# Patient Record
Sex: Female | Born: 1955 | Race: White | Hispanic: No | Marital: Married | Smoking: Never smoker
Health system: Southern US, Community
[De-identification: ages and names within clinical notes are randomized; demographics above are authoritative.]

## PROBLEM LIST (undated history)

## (undated) DIAGNOSIS — M8580 Other specified disorders of bone density and structure, unspecified site: Secondary | ICD-10-CM

## (undated) DIAGNOSIS — Z923 Personal history of irradiation: Secondary | ICD-10-CM

## (undated) DIAGNOSIS — E349 Endocrine disorder, unspecified: Secondary | ICD-10-CM

## (undated) DIAGNOSIS — C801 Malignant (primary) neoplasm, unspecified: Secondary | ICD-10-CM

## (undated) HISTORY — DX: Personal history of irradiation: Z92.3

## (undated) HISTORY — DX: Malignant (primary) neoplasm, unspecified: C80.1

## (undated) HISTORY — PX: OOPHORECTOMY: SHX86

## (undated) HISTORY — PX: BREAST BIOPSY: SHX20

## (undated) HISTORY — DX: Endocrine disorder, unspecified: E34.9

## (undated) HISTORY — DX: Other specified disorders of bone density and structure, unspecified site: M85.80

## (undated) HISTORY — PX: BREAST SURGERY: SHX581

## (undated) HISTORY — PX: BREAST EXCISIONAL BIOPSY: SUR124

---

## 2010-06-17 DIAGNOSIS — C801 Malignant (primary) neoplasm, unspecified: Secondary | ICD-10-CM

## 2010-06-17 DIAGNOSIS — C50919 Malignant neoplasm of unspecified site of unspecified female breast: Secondary | ICD-10-CM

## 2010-06-17 DIAGNOSIS — Z923 Personal history of irradiation: Secondary | ICD-10-CM

## 2010-06-17 HISTORY — DX: Malignant neoplasm of unspecified site of unspecified female breast: C50.919

## 2010-06-17 HISTORY — PX: BREAST LUMPECTOMY: SHX2

## 2010-06-17 HISTORY — DX: Personal history of irradiation: Z92.3

## 2010-06-17 HISTORY — DX: Malignant (primary) neoplasm, unspecified: C80.1

## 2016-06-26 ENCOUNTER — Ambulatory Visit: Payer: 59 | Admitting: Women's Health

## 2016-08-13 ENCOUNTER — Ambulatory Visit (INDEPENDENT_AMBULATORY_CARE_PROVIDER_SITE_OTHER): Payer: 59 | Admitting: Women's Health

## 2016-08-13 ENCOUNTER — Other Ambulatory Visit: Payer: Self-pay | Admitting: Women's Health

## 2016-08-13 ENCOUNTER — Encounter: Payer: Self-pay | Admitting: Women's Health

## 2016-08-13 VITALS — BP 132/80 | Ht 64.0 in | Wt 133.0 lb

## 2016-08-13 DIAGNOSIS — Z13220 Encounter for screening for lipoid disorders: Secondary | ICD-10-CM | POA: Diagnosis not present

## 2016-08-13 DIAGNOSIS — E038 Other specified hypothyroidism: Secondary | ICD-10-CM

## 2016-08-13 DIAGNOSIS — C50919 Malignant neoplasm of unspecified site of unspecified female breast: Secondary | ICD-10-CM | POA: Insufficient documentation

## 2016-08-13 DIAGNOSIS — Z01419 Encounter for gynecological examination (general) (routine) without abnormal findings: Secondary | ICD-10-CM | POA: Diagnosis not present

## 2016-08-13 DIAGNOSIS — Z13820 Encounter for screening for osteoporosis: Secondary | ICD-10-CM

## 2016-08-13 DIAGNOSIS — Z1231 Encounter for screening mammogram for malignant neoplasm of breast: Secondary | ICD-10-CM

## 2016-08-13 DIAGNOSIS — M8580 Other specified disorders of bone density and structure, unspecified site: Secondary | ICD-10-CM | POA: Diagnosis not present

## 2016-08-13 LAB — COMPREHENSIVE METABOLIC PANEL
ALK PHOS: 54 U/L (ref 33–130)
ALT: 29 U/L (ref 6–29)
AST: 24 U/L (ref 10–35)
Albumin: 4.4 g/dL (ref 3.6–5.1)
BUN: 16 mg/dL (ref 7–25)
CO2: 25 mmol/L (ref 20–31)
CREATININE: 0.68 mg/dL (ref 0.50–1.05)
Calcium: 9.4 mg/dL (ref 8.6–10.4)
Chloride: 103 mmol/L (ref 98–110)
Glucose, Bld: 110 mg/dL — ABNORMAL HIGH (ref 65–99)
POTASSIUM: 4.3 mmol/L (ref 3.5–5.3)
SODIUM: 138 mmol/L (ref 135–146)
TOTAL PROTEIN: 7.3 g/dL (ref 6.1–8.1)
Total Bilirubin: 0.5 mg/dL (ref 0.2–1.2)

## 2016-08-13 LAB — CBC WITH DIFFERENTIAL/PLATELET
BASOS PCT: 1 %
Basophils Absolute: 50 cells/uL (ref 0–200)
EOS ABS: 100 {cells}/uL (ref 15–500)
Eosinophils Relative: 2 %
HCT: 40.1 % (ref 35.0–45.0)
Hemoglobin: 13.7 g/dL (ref 11.7–15.5)
LYMPHS ABS: 1900 {cells}/uL (ref 850–3900)
Lymphocytes Relative: 38 %
MCH: 32 pg (ref 27.0–33.0)
MCHC: 34.2 g/dL (ref 32.0–36.0)
MCV: 93.7 fL (ref 80.0–100.0)
MONOS PCT: 9 %
MPV: 9.6 fL (ref 7.5–12.5)
Monocytes Absolute: 450 cells/uL (ref 200–950)
NEUTROS ABS: 2500 {cells}/uL (ref 1500–7800)
Neutrophils Relative %: 50 %
PLATELETS: 266 10*3/uL (ref 140–400)
RBC: 4.28 MIL/uL (ref 3.80–5.10)
RDW: 13.3 % (ref 11.0–15.0)
WBC: 5 10*3/uL (ref 3.8–10.8)

## 2016-08-13 LAB — LIPID PANEL
CHOLESTEROL: 226 mg/dL — AB (ref <200)
HDL: 147 mg/dL (ref >50)
LDL Cholesterol: 69 mg/dL (ref <100)
Total CHOL/HDL Ratio: 1.5 Ratio (ref <5.0)
Triglycerides: 51 mg/dL (ref <150)
VLDL: 10 mg/dL (ref <30)

## 2016-08-13 LAB — TSH: TSH: 0.91 m[IU]/L

## 2016-08-13 MED ORDER — LEVOTHYROXINE SODIUM 88 MCG PO TABS: 88.0000 ug | ORAL_TABLET | Freq: Every day | ORAL | 4 refills | Status: AC

## 2016-08-13 NOTE — Patient Instructions (Signed)
Dr Jana Hakim  Oncologist [facility] [phone]   Health Maintenance for Postmenopausal Women Menopause is a normal process in which your reproductive ability comes to an end. This process happens gradually over a span of months to years, usually between the ages of 36 and 77. Menopause is complete when you have missed 12 consecutive menstrual periods. It is important to talk with your health care provider about some of the most common conditions that affect postmenopausal women, such as heart disease, cancer, and bone loss (osteoporosis). Adopting a healthy lifestyle and getting preventive care can help to promote your health and wellness. Those actions can also lower your chances of developing some of these common conditions. What should I know about menopause? During menopause, you may experience a number of symptoms, such as:  Moderate-to-severe hot flashes.  Night sweats.  Decrease in sex drive.  Mood swings.  Headaches.  Tiredness.  Irritability.  Memory problems.  Insomnia. Choosing to treat or not to treat menopausal changes is an individual decision that you make with your health care provider. What should I know about hormone replacement therapy and supplements? Hormone therapy products are effective for treating symptoms that are associated with menopause, such as hot flashes and night sweats. Hormone replacement carries certain risks, especially as you become older. If you are thinking about using estrogen or estrogen with progestin treatments, discuss the benefits and risks with your health care provider. What should I know about heart disease and stroke? Heart disease, heart attack, and stroke become more likely as you age. This may be due, in part, to the hormonal changes that your body experiences during menopause. These can affect how your body processes dietary fats, triglycerides, and cholesterol. Heart attack and stroke are both medical  emergencies. There are many things that you can do to help prevent heart disease and stroke:  Have your blood pressure checked at least every 1-2 years. High blood pressure causes heart disease and increases the risk of stroke.  If you are 42-23 years old, ask your health care provider if you should take aspirin to prevent a heart attack or a stroke.  Do not use any tobacco products, including cigarettes, chewing tobacco, or electronic cigarettes. If you need help quitting, ask your health care provider.  It is important to eat a healthy diet and maintain a healthy weight.  Be sure to include plenty of vegetables, fruits, low-fat dairy products, and lean protein.  Avoid eating foods that are high in solid fats, added sugars, or salt (sodium).  Get regular exercise. This is one of the most important things that you can do for your health.  Try to exercise for at least 150 minutes each week. The type of exercise that you do should increase your heart rate and make you sweat. This is known as moderate-intensity exercise.  Try to do strengthening exercises at least twice each week. Do these in addition to the moderate-intensity exercise.  Know your numbers.Ask your health care provider to check your cholesterol and your blood glucose. Continue to have your blood tested as directed by your health care provider. What should I know about cancer screening? There are several types of cancer. Take the following steps to reduce your risk and to catch any cancer development as early as possible. Breast Cancer  Practice breast self-awareness.  This means understanding how your breasts normally appear and feel.  It also means doing regular breast self-exams. Let your health care provider know about any changes,  no matter how small.  If you are 92 or older, have a clinician do a breast exam (clinical breast exam or CBE) every year. Depending on your age, family history, and medical history, it may  be recommended that you also have a yearly breast X-ray (mammogram).  If you have a family history of breast cancer, talk with your health care provider about genetic screening.  If you are at high risk for breast cancer, talk with your health care provider about having an MRI and a mammogram every year.  Breast cancer (BRCA) gene test is recommended for women who have family members with BRCA-related cancers. Results of the assessment will determine the need for genetic counseling and BRCA1 and for BRCA2 testing. BRCA-related cancers include these types:  Breast. This occurs in males or females.  Ovarian.  Tubal. This may also be called fallopian tube cancer.  Cancer of the abdominal or pelvic lining (peritoneal cancer).  Prostate.  Pancreatic. Cervical, Uterine, and Ovarian Cancer  Your health care provider may recommend that you be screened regularly for cancer of the pelvic organs. These include your ovaries, uterus, and vagina. This screening involves a pelvic exam, which includes checking for microscopic changes to the surface of your cervix (Pap test).  For women ages 21-65, health care providers may recommend a pelvic exam and a Pap test every three years. For women ages 70-65, they may recommend the Pap test and pelvic exam, combined with testing for human papilloma virus (HPV), every five years. Some types of HPV increase your risk of cervical cancer. Testing for HPV may also be done on women of any age who have unclear Pap test results.  Other health care providers may not recommend any screening for nonpregnant women who are considered low risk for pelvic cancer and have no symptoms. Ask your health care provider if a screening pelvic exam is right for you.  If you have had past treatment for cervical cancer or a condition that could lead to cancer, you need Pap tests and screening for cancer for at least 20 years after your treatment. If Pap tests have been discontinued for  you, your risk factors (such as having a new sexual partner) need to be reassessed to determine if you should start having screenings again. Some women have medical problems that increase the chance of getting cervical cancer. In these cases, your health care provider may recommend that you have screening and Pap tests more often.  If you have a family history of uterine cancer or ovarian cancer, talk with your health care provider about genetic screening.  If you have vaginal bleeding after reaching menopause, tell your health care provider.  There are currently no reliable tests available to screen for ovarian cancer. Lung Cancer  Lung cancer screening is recommended for adults 4-92 years old who are at high risk for lung cancer because of a history of smoking. A yearly low-dose CT scan of the lungs is recommended if you:  Currently smoke.  Have a history of at least 30 pack-years of smoking and you currently smoke or have quit within the past 15 years. A pack-year is smoking an average of one pack of cigarettes per day for one year. Yearly screening should:  Continue until it has been 15 years since you quit.  Stop if you develop a health problem that would prevent you from having lung cancer treatment. Colorectal Cancer  This type of cancer can be detected and can often be prevented.  Routine colorectal cancer screening usually begins at age 39 and continues through age 76.  If you have risk factors for colon cancer, your health care provider may recommend that you be screened at an earlier age.  If you have a family history of colorectal cancer, talk with your health care provider about genetic screening.  Your health care provider may also recommend using home test kits to check for hidden blood in your stool.  A small camera at the end of a tube can be used to examine your colon directly (sigmoidoscopy or colonoscopy). This is done to check for the earliest forms of colorectal  cancer.  Direct examination of the colon should be repeated every 5-10 years until age 20. However, if early forms of precancerous polyps or small growths are found or if you have a family history or genetic risk for colorectal cancer, you may need to be screened more often. Skin Cancer  Check your skin from head to toe regularly.  Monitor any moles. Be sure to tell your health care provider:  About any new moles or changes in moles, especially if there is a change in a mole's shape or color.  If you have a mole that is larger than the size of a pencil eraser.  If any of your family members has a history of skin cancer, especially at a Aadam Zhen age, talk with your health care provider about genetic screening.  Always use sunscreen. Apply sunscreen liberally and repeatedly throughout the day.  Whenever you are outside, protect yourself by wearing long sleeves, pants, a wide-brimmed hat, and sunglasses. What should I know about osteoporosis? Osteoporosis is a condition in which bone destruction happens more quickly than new bone creation. After menopause, you may be at an increased risk for osteoporosis. To help prevent osteoporosis or the bone fractures that can happen because of osteoporosis, the following is recommended:  If you are 30-76 years old, get at least 1,000 mg of calcium and at least 600 mg of vitamin D per day.  If you are older than age 71 but younger than age 28, get at least 1,200 mg of calcium and at least 600 mg of vitamin D per day.  If you are older than age 72, get at least 1,200 mg of calcium and at least 800 mg of vitamin D per day. Smoking and excessive alcohol intake increase the risk of osteoporosis. Eat foods that are rich in calcium and vitamin D, and do weight-bearing exercises several times each week as directed by your health care provider. What should I know about how menopause affects my mental health? Depression may occur at any age, but it is more common as  you become older. Common symptoms of depression include:  Low or sad mood.  Changes in sleep patterns.  Changes in appetite or eating patterns.  Feeling an overall lack of motivation or enjoyment of activities that you previously enjoyed.  Frequent crying spells. Talk with your health care provider if you think that you are experiencing depression. What should I know about immunizations? It is important that you get and maintain your immunizations. These include:  Tetanus, diphtheria, and pertussis (Tdap) booster vaccine.  Influenza every year before the flu season begins.  Pneumonia vaccine.  Shingles vaccine. Your health care provider may also recommend other immunizations. This information is not intended to replace advice given to you by your health care provider. Make sure you discuss any questions you have with your health care provider. Document  Released: 07/26/2005 Document Revised: 12/22/2015 Document Reviewed: 03/07/2015 Elsevier Interactive Patient Education  2017 [employer].

## 2016-08-13 NOTE — Progress Notes (Signed)
Angela Madden [birth year] [id number]    History:    Presents for new pt annual exam. Moved from [place] 60 mo ago, records reviewed.  Postmenopausal, LMP 2014/no HRT. 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 BRCA negative.  Lumpectomy, radiation, completed 5 years of tamoxifen.. L breast hx of microcalcification and fibrocystic changes- biopsy benign. Hx of R oophorectomy in 103-17-1991d/t gangrenous ovary. Hx of hypothyroidism, controlled on Levothyroxine, superficial blood clots managed on baby Aspirin, allergies- Levocetrizine BID and Oxymetazoline HCl, and vit B12 and D QD. Last mammogram 04/17/15- nml, colonoscopy 2March 17, 2017nml, pap 06/2014 nml, negative high risk HPV, DEXA 06/2013 osteopenia L1-L4. Has received shingles vaccine. Complains of dyspareunia, wants to start something for relief.   Past medical history, past surgical history, family history and social history were all reviewed and documented in the EPIC chart. Retired, [place], difficulty transitioning. Lives with husband of 403yrs, also retired, and daughter 267 Son 24l[place] in [place] Father, died 26010-03-17 natural causes, hx of DM and HTN. Mother, died 603from Breast CA. Sister also has hx of DM. Drinks 3 glasses of wine every night.  ROS:  A ROS was performed and pertinent positives and negatives are included.  Exam:  Vitals:   08/13/16 0901  BP: 132/80  Weight: 133 lb (60.3 kg)  Height: '5\' 4"'  (1.626 m)   Body mass index is 22.83 kg/m.   General appearance:  Normal Thyroid:  Symmetrical, normal in size, without palpable masses or nodularity. Respiratory  Auscultation:  Clear without wheezing or rhonchi Cardiovascular  Auscultation:  Regular rate, without rubs, murmurs or gallops  Edema/varicosities:  Not grossly evident Abdominal  Soft,nontender, without masses, guarding or rebound.  Liver/spleen:  No organomegaly noted  Hernia:  None  appreciated  Skin  Inspection:  Grossly normal   Breasts: Examined lying and sitting. Dense, tender breasts.    Right: Without masses, retractions, discharge or axillary adenopathy. Scar on outline of nipple from surgery.     Left: Without masses, retractions, discharge or axillary adenopathy. Gentitourinary   Inguinal/mons:  Normal without inguinal adenopathy  External genitalia:  Normal  BUS/Urethra/Skene's glands:  Normal  Vagina:  Normal, dry  Cervix:  Normal  Uterus: retroerted, normal in size, shape and contour.  Midline and mobile  Adnexa/parametria:     Rt: Without masses or tenderness.   Lt: Without masses or tenderness.  Anus and perineum: Normal  Digital rectal exam: Normal sphincter tone without palpated masses or tenderness  Assessment/Plan:  61y.o.  G2P2 for new pt annual exam.    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  Levothyroxine 88 g by mouth daily proper administration reviewed. Refill given.. Referred to Oncologist for Breast CA f/u . Informed to make appt for another DEXA scan. Counseled on healthy eating and exercise. Encouraged to get Pneumovax and flu shots every year. Suggested she joins womens activity groups to help with settling in. CBC, CMP, Lipid, TSH, Vit D. Pap normal with negative HR HPV 602016/03/17 Informed to call if she has any questions of concerns.   YHuel CoteWHNP, 10:01  AM 60/27/2018

## 2016-08-14 ENCOUNTER — Other Ambulatory Visit: Payer: Self-pay | Admitting: Gynecology

## 2016-08-14 DIAGNOSIS — Z13820 Encounter for screening for osteoporosis: Secondary | ICD-10-CM

## 2016-08-14 DIAGNOSIS — M8580 Other specified disorders of bone density and structure, unspecified site: Secondary | ICD-10-CM

## 2016-08-14 LAB — HEMOGLOBIN A1C
HEMOGLOBIN A1C: 5.2 % (ref <5.7)
MEAN PLASMA GLUCOSE: 103 mg/dL

## 2016-08-14 LAB — VITAMIN D 25 HYDROXY (VIT D DEFICIENCY, FRACTURES): Vit D, 25-Hydroxy: 39 ng/mL (ref 30–100)

## 2016-08-15 DIAGNOSIS — M8580 Other specified disorders of bone density and structure, unspecified site: Secondary | ICD-10-CM

## 2016-08-15 HISTORY — DX: Other specified disorders of bone density and structure, unspecified site: M85.80

## 2016-08-20 ENCOUNTER — Other Ambulatory Visit: Payer: Self-pay | Admitting: Gynecology

## 2016-08-20 ENCOUNTER — Ambulatory Visit (INDEPENDENT_AMBULATORY_CARE_PROVIDER_SITE_OTHER): Payer: 59

## 2016-08-20 DIAGNOSIS — M8589 Other specified disorders of bone density and structure, multiple sites: Secondary | ICD-10-CM

## 2016-08-20 DIAGNOSIS — Z13820 Encounter for screening for osteoporosis: Secondary | ICD-10-CM | POA: Diagnosis not present

## 2016-08-20 DIAGNOSIS — M8580 Other specified disorders of bone density and structure, unspecified site: Secondary | ICD-10-CM

## 2016-08-21 ENCOUNTER — Encounter: Payer: Self-pay | Admitting: Gynecology

## 2016-08-30 ENCOUNTER — Ambulatory Visit
Admission: RE | Admit: 2016-08-30 | Discharge: 2016-08-30 | Disposition: A | Discharge status: Home / Self Care | Payer: 59 | Source: Ambulatory Visit | Attending: Women's Health | Admitting: Women's Health

## 2016-08-30 ENCOUNTER — Other Ambulatory Visit: Payer: Self-pay | Admitting: Women's Health

## 2016-08-30 ENCOUNTER — Telehealth: Payer: Self-pay | Admitting: *Deleted

## 2016-08-30 DIAGNOSIS — Z1231 Encounter for screening mammogram for malignant neoplasm of breast: Secondary | ICD-10-CM

## 2016-08-30 HISTORY — DX: Personal history of irradiation: Z92.3

## 2016-08-30 NOTE — Telephone Encounter (Signed)
Arena from breast center called stating pt will need a diagnostic bilateral mammogram and ultrasound due to history of breast center. Asked if verbal order okay. Pt was scheduled today for this. Breast center aware to proceed with the above.

## 2017-09-02 ENCOUNTER — Encounter: Payer: 59 | Admitting: Women's Health

## 2017-09-12 ENCOUNTER — Other Ambulatory Visit: Payer: Self-pay | Admitting: Obstetrics & Gynecology

## 2017-09-12 ENCOUNTER — Other Ambulatory Visit: Payer: Self-pay | Admitting: Women's Health

## 2017-09-12 DIAGNOSIS — Z139 Encounter for screening, unspecified: Secondary | ICD-10-CM

## 2017-10-02 ENCOUNTER — Ambulatory Visit
Admission: RE | Admit: 2017-10-02 | Discharge: 2017-10-02 | Disposition: A | Discharge status: Home / Self Care | Payer: 59 | Source: Ambulatory Visit | Attending: Women's Health | Admitting: Women's Health

## 2017-10-02 DIAGNOSIS — Z139 Encounter for screening, unspecified: Secondary | ICD-10-CM

## 2017-12-25 ENCOUNTER — Ambulatory Visit (INDEPENDENT_AMBULATORY_CARE_PROVIDER_SITE_OTHER): Payer: No Typology Code available for payment source | Admitting: Obstetrics & Gynecology

## 2017-12-25 ENCOUNTER — Encounter: Payer: Self-pay | Admitting: Obstetrics & Gynecology

## 2017-12-25 VITALS — BP 124/70 | Ht 63.0 in | Wt 123.0 lb

## 2017-12-25 DIAGNOSIS — M8589 Other specified disorders of bone density and structure, multiple sites: Secondary | ICD-10-CM

## 2017-12-25 DIAGNOSIS — Z780 Asymptomatic menopausal state: Secondary | ICD-10-CM

## 2017-12-25 DIAGNOSIS — R3121 Asymptomatic microscopic hematuria: Secondary | ICD-10-CM | POA: Diagnosis not present

## 2017-12-25 DIAGNOSIS — Z01419 Encounter for gynecological examination (general) (routine) without abnormal findings: Secondary | ICD-10-CM | POA: Diagnosis not present

## 2017-12-25 DIAGNOSIS — Z853 Personal history of malignant neoplasm of breast: Secondary | ICD-10-CM

## 2017-12-25 DIAGNOSIS — N952 Postmenopausal atrophic vaginitis: Secondary | ICD-10-CM

## 2017-12-25 DIAGNOSIS — Z170 Estrogen receptor positive status [ER+]: Secondary | ICD-10-CM | POA: Diagnosis not present

## 2017-12-25 DIAGNOSIS — C50911 Malignant neoplasm of unspecified site of right female breast: Secondary | ICD-10-CM

## 2017-12-25 NOTE — Patient Instructions (Signed)
1. Encounter for routine gynecological examination with Papanicolaou smear of cervix Normal gynecologic exam in menopause with atrophic vaginitis and atrophy of the vulvar skin.  Pap reflex done.  Breast exam normal.  Screening 3D mammogram was negative in April 2019.  Fasting health labs here today.  Colonoscopy in 2017. - CBC - Comp Met (CMET) - TSH - Lipid panel - VITAMIN D 25 Hydroxy (Vit-D Deficiency, Fractures)  2. Menopause present Well on no hormone replacement therapy.  No postmenopausal bleeding.  3. Post-menopausal atrophic vaginitis Symptoms of postmenopausal atrophic vaginitis and atrophic of the vulvar skin are causing dryness and pain with intercourse.  Suggested to try Coconut oil.  Astroglide is another possibility as a lubricant alternative to [place].  4. Malignant neoplasm of right breast in female, estrogen receptor positive, unspecified site of breast ([place]) Normal breast exam today and negative screening mammogram in April 2019.  5. Osteopenia of multiple sites Last bone density in March 2018 showed osteopenia with the lowest T score at the spine and right femoral neck at -1.8.  Will check her vitamin D today.  Continue with vitamin D supplements, calcium rich nutrition and regular weightbearing physical activity.  Will repeat bone density in March 2020.  Aryiah, it was a pleasure meeting you today!  I will inform you of your results as soon as they are available.

## 2017-12-25 NOTE — Addendum Note (Signed)
Addended by: Thurnell Garbe A on: 12/25/2017 12:38 PM   Modules accepted: Orders

## 2017-12-25 NOTE — Addendum Note (Signed)
Addended by: Thurnell Garbe A on: 12/25/2017 02:00 PM   Modules accepted: Orders

## 2017-12-25 NOTE — Progress Notes (Addendum)
Angela Madden 12-07-1955 [id number]   History:    62 y.o. G2P2L2 Married.  Moved from [place] 1 1/2 yr ago.  RP:  Established patient presenting for annual gyn exam   HPI: Menopause, well on no HRT.  H/O Rt Breast Ca in 2012 DCIS/High grade, ER/PR pos, Stage 0.  Rt Lumpectomy/Radiation therapy/Tamoxifen x 5 yrs completed.  BrCa 1-2 negative.  No pelvic pain.  Not sexually active x 1 year because of dryness/pain with IC.  [place] not helpful.  Urine/BMs wnl.  Breasts wnl.  BMI 21.79.  Walks her dog 2-3 times a day.  Fasting Health labs here today. Colonoscopy 2017.  Past medical history,surgical history, family history and social history were all reviewed and documented in the EPIC chart.  Gynecologic History No LMP recorded. Patient is postmenopausal. Contraception: post menopausal status Last Pap: 2016-2017 in [place]. Results were: normal per patient Last mammogram: 09/2017. Results were: 3D, Cat C, Negative Bone Density: 08/2016 Osteopenia Lowest T-Score -1.8 at spine and Rt femoral neck. Colonoscopy: 2017  Obstetric History OB History  Gravida Para Term Preterm AB Living  '2 2       2  ' SAB TAB Ectopic Multiple Live Births               # Outcome Date GA Lbr Len/2nd Weight Sex Delivery Anes PTL Lv  2 Para           1 Para              ROS: A ROS was performed and pertinent positives and negatives are included in the history.  GENERAL: No fevers or chills. HEENT: No change in vision, no earache, sore throat or sinus congestion. NECK: No pain or stiffness. CARDIOVASCULAR: No chest pain or pressure. No palpitations. PULMONARY: No shortness of breath, cough or wheeze. GASTROINTESTINAL: No abdominal pain, nausea, vomiting or diarrhea, melena or bright red blood per rectum. GENITOURINARY: No urinary frequency, urgency, hesitancy or dysuria. MUSCULOSKELETAL: No joint or muscle pain, no back pain, no recent trauma. DERMATOLOGIC: No rash, no itching, no lesions. ENDOCRINE: No polyuria,  polydipsia, no heat or cold intolerance. No recent change in weight. HEMATOLOGICAL: No anemia or easy bruising or bleeding. NEUROLOGIC: No headache, seizures, numbness, tingling or weakness. PSYCHIATRIC: No depression, no loss of interest in normal activity or change in sleep pattern.     Exam:   BP 124/70   Ht '5\' 3"'  (1.6 m)   Wt 123 lb (55.8 kg)   BMI 21.79 kg/m   Body mass index is 21.79 kg/m.  General appearance : Well developed well nourished female. No acute distress HEENT: Eyes: no retinal hemorrhage or exudates,  Neck supple, trachea midline, no carotid bruits, no thyroidmegaly Lungs: Clear to auscultation, no rhonchi or wheezes, or rib retractions  Heart: Regular rate and rhythm, no murmurs or gallops Breast:Examined in sitting and supine position were symmetrical in appearance, no palpable masses or tenderness,  no skin retraction, no nipple inversion, no nipple discharge, no skin discoloration, no axillary or supraclavicular lymphadenopathy Abdomen: no palpable masses or tenderness, no rebound or guarding Extremities: no edema or skin discoloration or tenderness  Pelvic: Vulva: Normal             Vagina: No gross lesions or discharge  Cervix: No gross lesions or discharge.  Pap reflex done.  Uterus  AV, normal size, shape and consistency, non-tender and mobile  Adnexa  Without masses or tenderness  Anus: Normal   Assessment/Plan:  61  y.o. female for annual exam   1. Encounter for routine gynecological examination with Papanicolaou smear of cervix Normal gynecologic exam in menopause with atrophic vaginitis and atrophy of the vulvar skin.  Pap reflex done.  Breast exam normal.  Screening 3D mammogram was negative in April 2019.  Fasting health labs here today.  Colonoscopy in 2017. - CBC - Comp Met (CMET) - TSH - Lipid panel - VITAMIN D 25 Hydroxy (Vit-D Deficiency, Fractures)  2. Menopause present Well on no hormone replacement therapy.  No postmenopausal  bleeding.  3. Post-menopausal atrophic vaginitis Symptoms of postmenopausal atrophic vaginitis and atrophic of the vulvar skin are causing dryness and pain with intercourse.  Suggested to try Coconut oil.  Astroglide is another possibility as a lubricant alternative to [place].  4. Malignant neoplasm of right breast in female, estrogen receptor positive, unspecified site of breast ([place]) Normal breast exam today and negative screening mammogram in April 2019.  5. Osteopenia of multiple sites Last bone density in March 2018 showed osteopenia with the lowest T score at the spine and right femoral neck at -1.8.  Will check her vitamin D today.  Continue with vitamin D supplements, calcium rich nutrition and regular weightbearing physical activity.  Will repeat bone density in March 2020.  6. Asymptomatic microscopic hematuria U/A normal.  Yellow clear urine, nitrites negative, white blood cells 0-5, red blood cells 0-2, no bacteria.  Counseling on above issues and coordination of care more than 50% for 10 minutes.  Princess Bruins MD, 11:50 AM 12/25/2017

## 2017-12-26 LAB — PAP IG W/ RFLX HPV ASCU

## 2017-12-27 LAB — URINE CULTURE
MICRO NUMBER:: 90827884
RESULT: NO GROWTH
SPECIMEN QUALITY: ADEQUATE

## 2017-12-27 LAB — URINALYSIS, COMPLETE W/RFL CULTURE
BACTERIA UA: NONE SEEN /HPF
BILIRUBIN URINE: NEGATIVE
Glucose, UA: NEGATIVE
Hyaline Cast: NONE SEEN /LPF
Ketones, ur: NEGATIVE
Leukocyte Esterase: NEGATIVE
Nitrites, Initial: NEGATIVE
PH: 6 (ref 5.0–8.0)
Protein, ur: NEGATIVE
SPECIFIC GRAVITY, URINE: 1.003 (ref 1.001–1.03)

## 2017-12-27 LAB — CULTURE INDICATED

## 2017-12-30 LAB — COMPREHENSIVE METABOLIC PANEL
AG Ratio: 1.8 (calc) (ref 1.0–2.5)
ALBUMIN MSPROF: 4.7 g/dL (ref 3.6–5.1)
ALT: 12 U/L (ref 6–29)
AST: 16 U/L (ref 10–35)
Alkaline phosphatase (APISO): 73 U/L (ref 33–130)
BILIRUBIN TOTAL: 0.9 mg/dL (ref 0.2–1.2)
BUN: 11 mg/dL (ref 7–25)
CALCIUM: 9.8 mg/dL (ref 8.6–10.4)
CHLORIDE: 99 mmol/L (ref 98–110)
CO2: 27 mmol/L (ref 20–32)
Creat: 0.74 mg/dL (ref 0.50–0.99)
GLOBULIN: 2.6 g/dL (ref 1.9–3.7)
GLUCOSE: 119 mg/dL — AB (ref 65–99)
POTASSIUM: 4.1 mmol/L (ref 3.5–5.3)
Sodium: 136 mmol/L (ref 135–146)
TOTAL PROTEIN: 7.3 g/dL (ref 6.1–8.1)

## 2017-12-30 LAB — TSH: TSH: 0.76 m[IU]/L (ref 0.40–4.50)

## 2017-12-30 LAB — CBC
HCT: 41.6 % (ref 35.0–45.0)
Hemoglobin: 14.1 g/dL (ref 11.7–15.5)
MCH: 30.7 pg (ref 27.0–33.0)
MCHC: 33.9 g/dL (ref 32.0–36.0)
MCV: 90.4 fL (ref 80.0–100.0)
MPV: 9.9 fL (ref 7.5–12.5)
PLATELETS: 322 10*3/uL (ref 140–400)
RBC: 4.6 10*6/uL (ref 3.80–5.10)
RDW: 11.7 % (ref 11.0–15.0)
WBC: 5.7 10*3/uL (ref 3.8–10.8)

## 2017-12-30 LAB — LIPID PANEL
Cholesterol: 234 mg/dL — ABNORMAL HIGH (ref <200)
HDL: 121 mg/dL (ref >50)
LDL Cholesterol (Calc): 98 mg/dL (calc)
NON-HDL CHOLESTEROL (CALC): 113 mg/dL (ref <130)
TRIGLYCERIDES: 60 mg/dL (ref <150)
Total CHOL/HDL Ratio: 1.9 (calc) (ref <5.0)

## 2017-12-30 LAB — HEMOGLOBIN A1C
EAG (MMOL/L): 6 (calc)
Hgb A1c MFr Bld: 5.4 % of total Hgb (ref <5.7)
Mean Plasma Glucose: 108 (calc)

## 2017-12-30 LAB — TEST AUTHORIZATION

## 2017-12-30 LAB — VITAMIN D 25 HYDROXY (VIT D DEFICIENCY, FRACTURES): Vit D, 25-Hydroxy: 50 ng/mL (ref 30–100)

## 2018-07-13 ENCOUNTER — Telehealth: Payer: Self-pay | Admitting: *Deleted

## 2018-07-13 NOTE — Telephone Encounter (Signed)
Pt seeing PCP 07/17/2018 requested labwork be sent to him Dr Bernerd Limbo. Completed. KW CMA

## 2018-11-11 ENCOUNTER — Other Ambulatory Visit: Payer: Self-pay | Admitting: Obstetrics & Gynecology

## 2018-11-11 DIAGNOSIS — Z1231 Encounter for screening mammogram for malignant neoplasm of breast: Secondary | ICD-10-CM

## 2018-12-12 ENCOUNTER — Ambulatory Visit
Admission: RE | Admit: 2018-12-12 | Discharge: 2018-12-12 | Disposition: A | Discharge status: Home / Self Care | Payer: No Typology Code available for payment source | Source: Ambulatory Visit | Attending: Obstetrics & Gynecology | Admitting: Obstetrics & Gynecology

## 2018-12-12 ENCOUNTER — Other Ambulatory Visit: Payer: Self-pay

## 2018-12-12 DIAGNOSIS — Z1231 Encounter for screening mammogram for malignant neoplasm of breast: Secondary | ICD-10-CM

## 2019-03-16 ENCOUNTER — Encounter: Payer: Self-pay | Admitting: Gynecology

## 2019-11-02 ENCOUNTER — Other Ambulatory Visit: Payer: Self-pay | Admitting: Obstetrics & Gynecology

## 2019-11-02 DIAGNOSIS — Z1231 Encounter for screening mammogram for malignant neoplasm of breast: Secondary | ICD-10-CM

## 2019-12-13 ENCOUNTER — Ambulatory Visit
Admission: RE | Admit: 2019-12-13 | Discharge: 2019-12-13 | Disposition: A | Discharge status: Home / Self Care | Payer: No Typology Code available for payment source | Source: Ambulatory Visit | Attending: Obstetrics & Gynecology | Admitting: Obstetrics & Gynecology

## 2019-12-13 ENCOUNTER — Other Ambulatory Visit: Payer: Self-pay

## 2019-12-13 DIAGNOSIS — Z1231 Encounter for screening mammogram for malignant neoplasm of breast: Secondary | ICD-10-CM

## 2020-11-02 ENCOUNTER — Other Ambulatory Visit: Payer: Self-pay | Admitting: Obstetrics & Gynecology

## 2020-11-02 DIAGNOSIS — Z1231 Encounter for screening mammogram for malignant neoplasm of breast: Secondary | ICD-10-CM

## 2021-01-01 ENCOUNTER — Ambulatory Visit
Admission: RE | Admit: 2021-01-01 | Discharge: 2021-01-01 | Disposition: A | Discharge status: Home / Self Care | Payer: No Typology Code available for payment source | Source: Ambulatory Visit | Attending: Obstetrics & Gynecology | Admitting: Obstetrics & Gynecology

## 2021-01-01 ENCOUNTER — Other Ambulatory Visit: Payer: Self-pay

## 2021-01-01 DIAGNOSIS — Z1231 Encounter for screening mammogram for malignant neoplasm of breast: Secondary | ICD-10-CM

## 2021-09-30 IMAGING — MG DIGITAL SCREENING BILAT W/ TOMO W/ CAD
8 series · 9 of 24 positions shown · non-contrast
Comparison: Previous exam(s).

CLINICAL DATA: Screening.

EXAM:
DIGITAL SCREENING BILATERAL MAMMOGRAM WITH TOMO AND CAD

[L CC synth-2D]
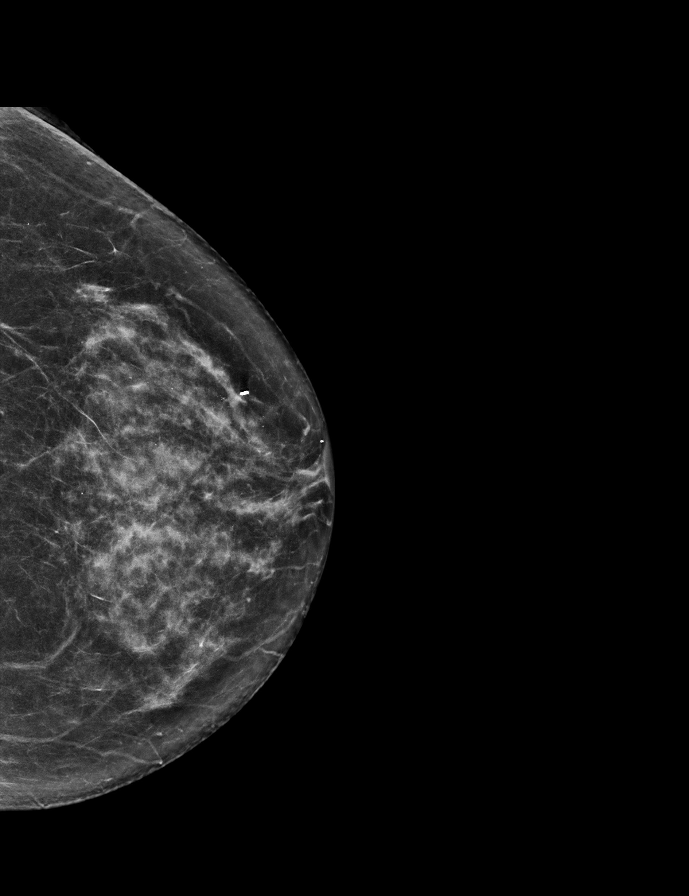

[L MLO synth-2D]
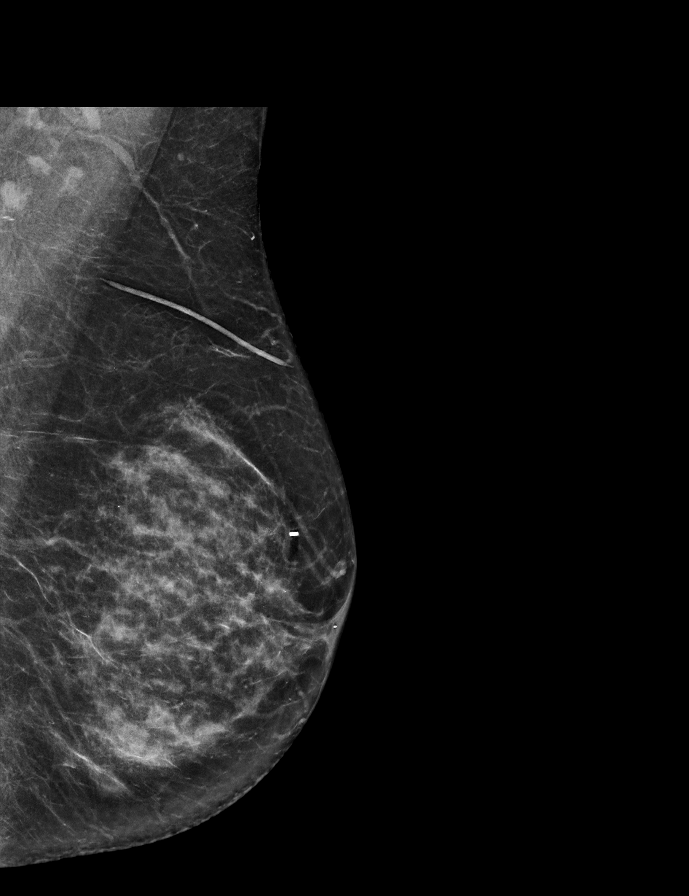

[R CC synth-2D]
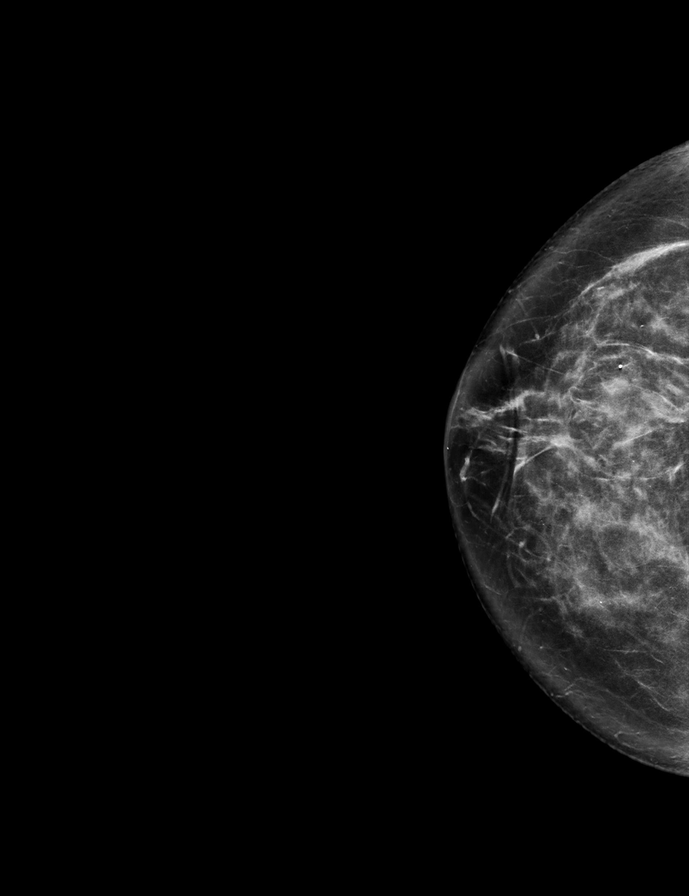

[R MLO synth-2D]
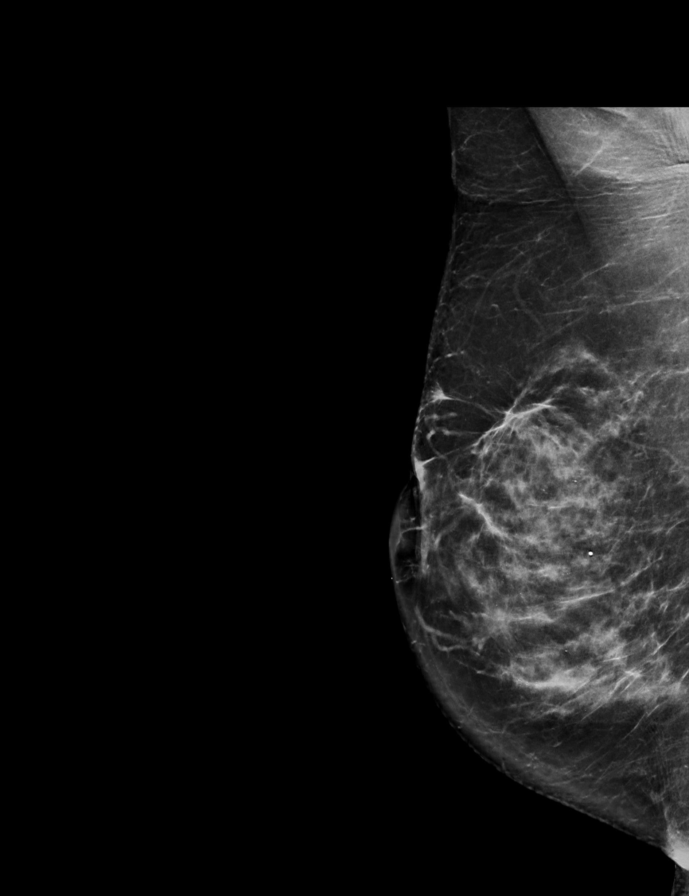

[L CC tomo · 2 of 66 frames shown]
[frame 22/66]
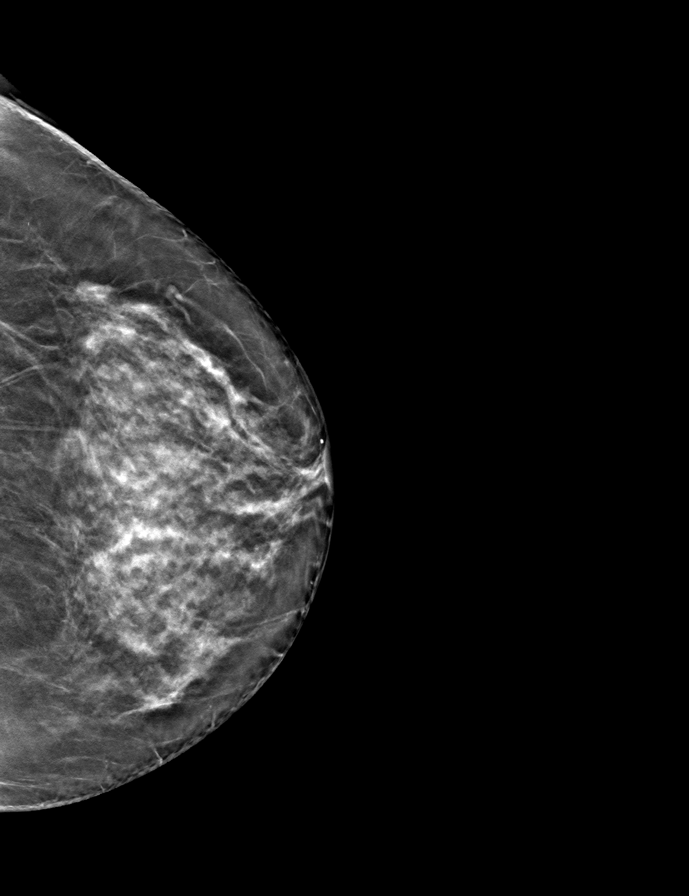
[frame 33/66]
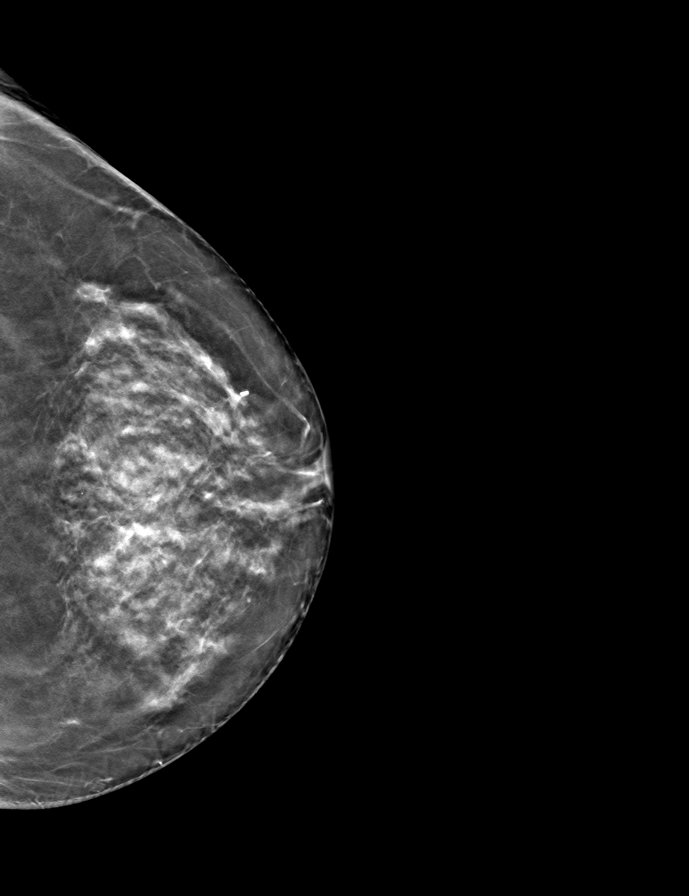

[L MLO tomo · tomo slice 33/66.0]
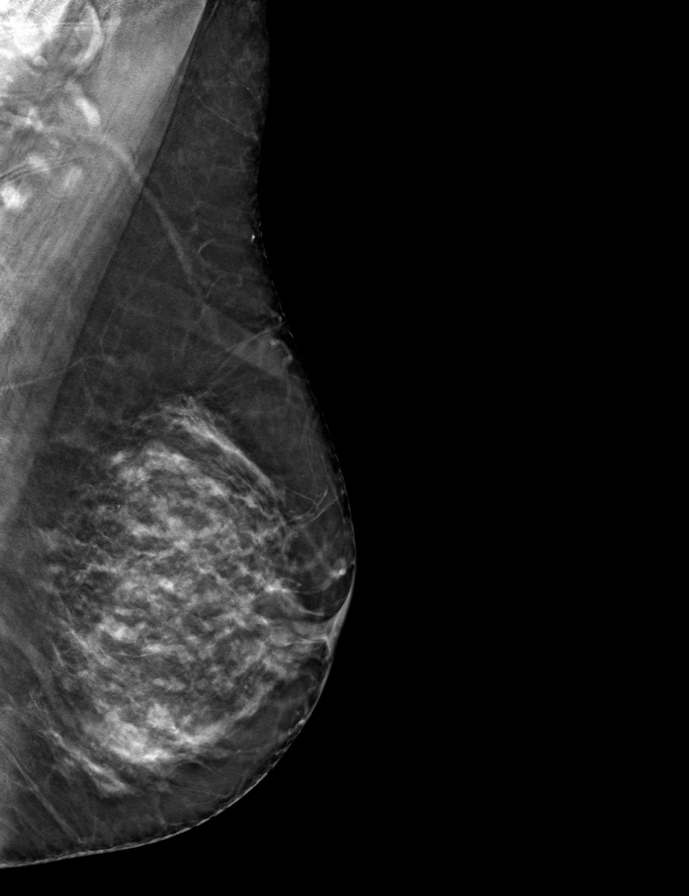

[R CC tomo · tomo slice 37/74.0]
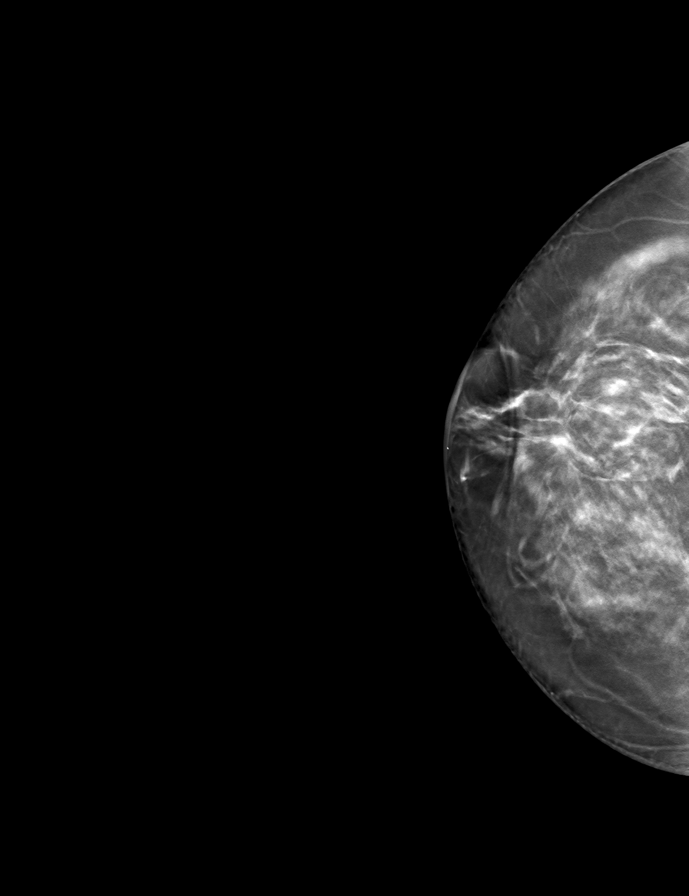

[R MLO tomo · tomo slice 37/73.0]
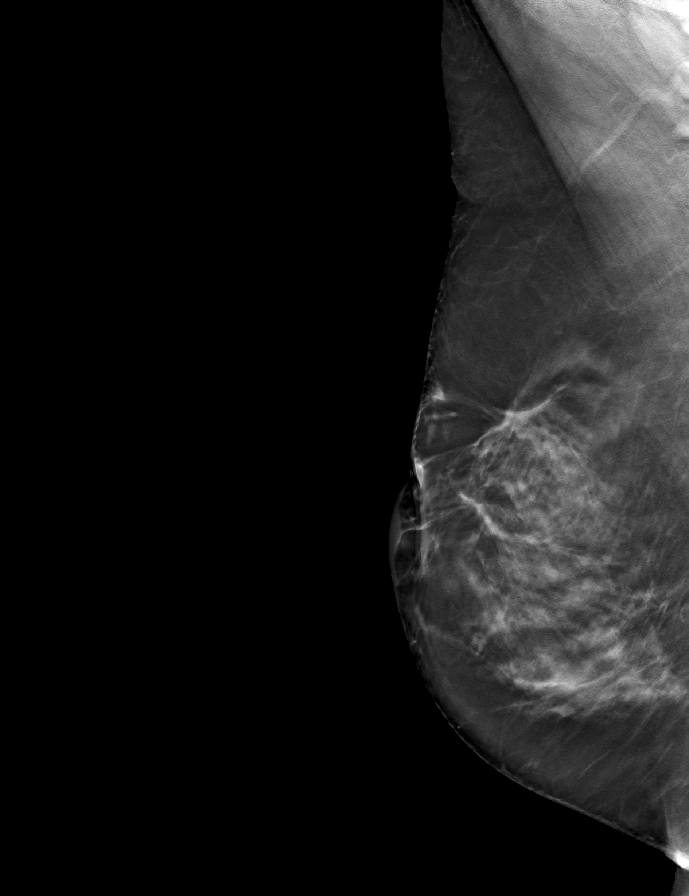

[9 of 24 positions shown; findings below may reference images not displayed]

ACR Breast Density Category c: The breast tissue is heterogeneously
dense, which may obscure small masses.
FINDINGS: There are no findings suspicious for malignancy. Images were
processed with CAD.
IMPRESSION: No mammographic evidence of malignancy. A result letter of this
screening mammogram will be mailed directly to the patient.

RECOMMENDATION:
Screening mammogram in one year. (Code:[id number]-[id number])

BI-RADS CATEGORY  1: Negative.
